# Patient Record
Sex: Female | Born: 1955 | Race: White | Hispanic: No | Marital: Married
Health system: Midwestern US, Community
[De-identification: ages and names within clinical notes are randomized; demographics above are authoritative.]

---

## 2014-09-05 NOTE — Telephone Encounter (Signed)
It looks like you just had her use Boudreaux for irritation and regular Analpram 2.5% for extrernal hemorrhoids in the past. She had [employer] self-insured last time she came in. I really don't think any insurances cover suppositories. Perhaps something over the counter? Or maybe I can give her CalMol samples if we have any left? Otherwise, she may need an OV to discuss possible surgical intervention?

## 2014-09-05 NOTE — Telephone Encounter (Signed)
LM for patient to try CalMol for a few weeks. If no improvement, or worsened sx, call for an OV.

## 2014-09-05 NOTE — Telephone Encounter (Signed)
Use Calmol and hydrocortisone 2.5% cream externally.  Good idea.

## 2014-09-05 NOTE — Telephone Encounter (Signed)
I need her record to review.  Also her insurance may dictate what I can prescribe.  If she is the one who used the Advanced Kit, there is no alternative.

## 2014-09-05 NOTE — Telephone Encounter (Signed)
Wants a hemorrhoid cream/treatment that is internal as well as the external Analpram she was prescribed last time. She has internal and external hemorrhoids, and the internal ones are flared up right now. I am not sure what is available for internal, if you need to see her I can make an OV, she said she was "just checked out" a few months ago (colonoscopy) and it's just the same usual hemorrhoids. Advise please.

## 2018-09-01 ENCOUNTER — Ambulatory Visit: Admit: 2018-09-01 | Discharge: 2018-09-01 | Payer: PRIVATE HEALTH INSURANCE | Attending: Surgery

## 2018-09-01 DIAGNOSIS — N816 Rectocele: Secondary | ICD-10-CM

## 2018-09-01 NOTE — Progress Notes (Addendum)
PATIENT NAME: Sheri Mcdaniel    DOB:  12-Jun-1956    MRN:  [id number]   TODAY'S DATE:  09/01/2018    Chief Complaint   Patient presents with   . Surgical Consult     pt has feeling of incomplete emptying, also abd pain, last colonoscopy 2015 - no polyps (Dr. Hedwig Morton)     SUBJECTIVE:  Sheri Mcdaniel is a 62 y.o. female with a feeling of incomplete emptying with bowel movements. This started about 1 year ago.  Pt states she goes everyday and sometimes 3 times a day because she feels like it is incomplete.  Pt is straining to have a BM.  Pt states that when she tries to go and stands up she feels like she has to sit right back down and go again. The consistency is like soft and gummy, occasionally it is extremely hard.  She had 3 vaginal births, and she did have an episiotomy with the first one.  She sometimes has to manually disimpact her self to get the stool out and does not have to place a finger in there vagina.  She had 2 bladder prolapse repairs. Pt has gained 20 lbs this year.     2015 colonoscopy for rectal bleeding normal    Review of Systems   Constitutional: Negative for chills and fever.   HENT: Negative for ear pain and sinus pain.    Eyes: Negative for pain and redness.   Respiratory: Negative for chest tightness and shortness of breath.    Cardiovascular: Negative for chest pain and palpitations.   Gastrointestinal: Negative for abdominal distention and vomiting.   Genitourinary: Negative for difficulty urinating and dysuria.   Musculoskeletal: Negative for arthralgias and myalgias.   Skin: Negative for color change and rash.   Psychiatric/Behavioral: Negative for agitation and confusion.       Past Medical History:   Diagnosis Date   . Kidney failure     Stage 2       Past Surgical History:   Procedure Laterality Date   . BLADDER SURGERY      Bladder Uplift (twice)   . CHOLECYSTECTOMY     . HYSTERECTOMY         Current Outpatient Medications   Medication Sig Dispense Refill   . Vitamin E 400 units  TABS Take one(1) tablet daily.     Marland Kitchen dicyclomine (BENTYL) 10 MG capsule TAKE 1 CAPSULE BY MOUTH FOUR TIMES DAILY  0   . potassium chloride (KLOR-CON) 10 MEQ extended release tablet      . metOLazone (ZAROXOLYN) 5 MG tablet      . naproxen (NAPROSYN) 500 MG tablet      . montelukast (SINGULAIR) 10 MG tablet Take 10 mg by mouth     . omeprazole (PRILOSEC) 40 MG delayed release capsule Take 40 mg by mouth     . MAGNESIUM PO Take 350 mg by mouth     . Cholecalciferol (VITAMIN D3 PO) Take 4,000 Units by mouth     . amoxicillin-clavulanate (AUGMENTIN) 875-125 MG per tablet   0   . OSPHENA 60 MG TABS   11     No current facility-administered medications for this visit.        Social History     Socioeconomic History   . Marital status: Married     Spouse name: Not on file   . Number of children: Not on file   . Years of education: Not on file   .  Highest education level: Not on file   Occupational History   . Not on file   Social Needs   . Financial resource strain: Not on file   . Food insecurity:     Worry: Not on file     Inability: Not on file   . Transportation needs:     Medical: Not on file     Non-medical: Not on file   Tobacco Use   . Smoking status: Never Smoker   . Smokeless tobacco: Never Used   Substance and Sexual Activity   . Alcohol use: Not Currently   . Drug use: Never   . Sexual activity: Not Currently   Lifestyle   . Physical activity:     Days per week: Not on file     Minutes per session: Not on file   . Stress: Not on file   Relationships   . Social connections:     Talks on phone: Not on file     Gets together: Not on file     Attends religious service: Not on file     Active member of club or organization: Not on file     Attends meetings of clubs or organizations: Not on file     Relationship status: Not on file   . Intimate partner violence:     Fear of current or ex partner: Not on file     Emotionally abused: Not on file     Physically abused: Not on file     Forced sexual activity: Not on file    Other Topics Concern   . Not on file   Social History Narrative   . Not on file       Family History   Problem Relation Age of Onset   . Breast Cancer Maternal Aunt 65   . Uterine Cancer Daughter 20       Allergies:  Allergies   Allergen Reactions   . Latex    . Adhesive Tape        OBJECTIVE:  BP (!) 143/76   Pulse 100   Temp 98.9 F (37.2 C) (Temporal)   Ht 5' (1.524 m)   Wt 148 lb (67.1 kg)   BMI 28.90 kg/m     GENERAL: No acute distress  HEENT:  NCAT, Pupils equal, EOMI, normal conjunctivae, no scleral icterus  NECK: Symmetric, trachea midline, no obvious thyromegaly  PULMONARY:  Normal respiratory effort, no respiratory distress, no stridor, chest non-tender  CARDIO: regular rate, no edema  ABDOMEN: soft, non-distended, non-tender, no guarding, no rebound, no obvious hepatosplenomegaly.   SKIN: Skin warm and well perfused, no cyanosis atraumatic, no rashes, no lesions  NEURO:  Cranial nerves grossly intact with no focal neuro defectis, motor and sensation grossly intact  PSYCHIATRIC: Appropriate mood and affect.  Appropriate judgment and insight    Perianal skin appears normal, no rashes.  Patient with external hemorrhoids in the right anterior right posterior and left lateral position that are small without any thrombosis  Perianal sensation normal.     Digital Exam: Tone: Normal; Palpable masses: No.  Small anterior rectocele.    Anoscope: Internal hemorrhoids are mildly enlarged, not friable without any bleeding in the left lateral, right posterior and right anterior positions.    Toilet exam: no prolapse noted.    Exam chaperoned by female assistant.      ASSESSMENT/PLAN:   Diagnosis Orders   1. Rectocele  ANOSCOPY, DIAGNOSTIC   2. Tenesmus (rectal)  ANOSCOPY, DIAGNOSTIC   3. External hemorrhoid  ANOSCOPY, DIAGNOSTIC   4. Internal hemorrhoid  ANOSCOPY, DIAGNOSTIC   5. Change in bowel habit  ANOSCOPY, DIAGNOSTIC     Patient with an anterior rectocele likely causing her issues of incomplete emptying.   Patient is due for a colonoscopy in 2020, with these changes in bowel habit would recommend a colonoscopy now to rule out any other causes.  Would recommend fiber powder therapy and adequate water intake daily.  If symptoms do not improve and patient has no lesion seen on colonoscopy would likely need a rectocele repair.  May need to perform defecography if symptoms are not improving.  If patient needs a rectocele repair would refer to urogynecology.    Details of colonoscopy are discussed including the risks of bleeding, perforation, splenic injury, respiratory and anesthetic complications.  We will schedule for colonoscopy.    Return for scheduled procedure.    The patient was given the opportunity to ask questions and all questions were answered to the best of my ability. The patient agrees with the above noted plan.    The patient was seen and examined independently and relevant data reviewed by myself. A full chart review including labs, imaging, endoscopic reports, pathology, and requesting/reviewing previous records was performed when available.

## 2018-09-18 ENCOUNTER — Inpatient Hospital Stay: Admit: 2018-09-18 | Payer: PRIVATE HEALTH INSURANCE | Attending: Surgery

## 2018-09-18 MED ORDER — NORMAL SALINE FLUSH 0.9 % IV SOLN
0.9 % | Freq: Two times a day (BID) | INTRAVENOUS | Status: DC
Start: 2018-09-18 — End: 2018-09-18

## 2018-09-18 MED ORDER — LIDOCAINE HCL (PF) 2 % IJ SOLN
2 % | INTRAMUSCULAR | Status: AC
Start: 2018-09-18 — End: ?

## 2018-09-18 MED ORDER — SODIUM CHLORIDE 0.9 % IV SOLN
0.9 % | INTRAVENOUS | Status: DC
Start: 2018-09-18 — End: 2018-09-18

## 2018-09-18 MED ORDER — NORMAL SALINE FLUSH 0.9 % IV SOLN
0.9 % | INTRAVENOUS | Status: DC | PRN
Start: 2018-09-18 — End: 2018-09-18

## 2018-09-18 MED ORDER — PROPOFOL 200 MG/20ML IV EMUL
200 MG/20ML | INTRAVENOUS | Status: AC
Start: 2018-09-18 — End: ?

## 2018-09-18 MED ORDER — SODIUM CHLORIDE 0.9 % IV SOLN
0.9 % | INTRAVENOUS | Status: DC
Start: 2018-09-18 — End: 2018-09-18
  Administered 2018-09-18: 14:00:00 via INTRAVENOUS

## 2018-09-18 MED FILL — LIDOCAINE HCL (PF) 2 % IJ SOLN: 2 % | INTRAMUSCULAR | Qty: 5

## 2018-09-18 MED FILL — PROPOFOL 200 MG/20ML IV EMUL: 200 MG/20ML | INTRAVENOUS | Qty: 60

## 2018-09-18 NOTE — Other (Unsigned)
Patient Acct Nbr: [id number]   Primary AUTH/CERT:   Primary Insurance Company Name: [employer]  Primary Insurance Plan name: [employer]  Primary Insurance Group Number: [id number]  Primary Insurance Plan Type: Health  Primary Insurance Policy Number: [id number]

## 2018-09-18 NOTE — Discharge Instructions (Signed)
Colonoscopy: What to expect at home    ACTIVITY:  DO NOT DRIVE, OPERATE MACHINERY, OR DRINK ANY ALCOHOL TODAY.     Avoid making critical decisions, signing legal documents, or performing any activity that requires alertness for the rest of the day.     You may be bloated or have gas pains since air was introduced into the colon for the procedure. You may need to pass the gas throughout the day.     You may experience a small amount of rectal bleeding; this can be normal after your colonoscopy. Notify your physician if the bleeding is enough to saturate your clothes.  Rest the remainder of the day.    You may resume normal activity tomorrow.     You may return to work tomorrow.    DIET:  You may resume a normal diet unless notified or recommended by your physician.    You may be eager to eat a large meal after fasting, but it is a good idea to start with light meals and ease into solid foods the first day. (*)    If your stomach is upset, try clear liquids and bland, low-fat foods like plain toast or rice.  Drink plenty of fluids for the first 24 hours (unless your physician states otherwise).    MEDICATION:  Resume your normal home medications unless notified or recommended by your physician.    If you take blood thinners (such as Coumadin, Eliquis, Plavix, Aspirin, etc.) or anti-inflammatory medications (Advil, Motrin, Aleve, etc.), ask your physician when you may resume these medications.    FOLLOW-UP APPOINTMENT:  Follow up with or call your physician as needed.    When to call for help:  Call your doctor IMMEDIATELY or seek medical care if you experience:  . Severe pain or vomiting  . A large amount (filling the toilet) of maroon, bloody stools or tar-like stools  . Your belly is swollen and firm with severe pain  . A  fever greater than 101 degrees  . Redness or swelling of arm from the IV site for more than 48 hours  . Sudden onset of chest pain or shortness of breath   . If you become extremely dizzy or pass  out (lose consciousness)   **IF YOU ARE UNABLE TO REACH YOUR PHYSICIAN GO TO NEAREST EMERGENCY DEPARTMENT**

## 2018-09-18 NOTE — H&P (Signed)
Endoscopy History and Physical    PATIENT NAME: Sheri Mcdaniel   DATE OF BIRTH: 08/02/1956    ADMISSION DATE: 09/18/2018  8:08 AM      TODAY'S DATE: 09/18/2018    HISTORY OF PRESENT ILLNESS:  The patient is a 62 y.o. female who presents with feeling of incomplete emptying with bowel movements. This started about 1 year ago. Likely has an anterior rectocele likely causing her issues of incomplete emptying. She had 3 vaginal births, and she did have an episiotomy with the first one.  She sometimes has to manually disimpact her self to get the stool out and does not have to place a finger in there vagina.  Patient is due for a colonoscopy in 2020, with these changes in bowel habit would recommend a colonoscopy now to rule out any other causes.   Thoroughly reviewed the patient's medical history, family history, social history and review of systems with the patient today in the office.  Please see medical record for pertinent positives.     Past Medical History:        Diagnosis Date   ??? Kidney failure     Stage 2       Past Surgical History:        Procedure Laterality Date   ??? BLADDER SURGERY      Bladder Uplift (twice)   ??? CHOLECYSTECTOMY     ??? HYSTERECTOMY         Current Medications:   Current Facility-Administered Medications: 0.9 % sodium chloride infusion, , Intravenous, Continuous  0.9 % sodium chloride infusion, , Intravenous, Continuous  sodium chloride flush 0.9 % injection 10 mL, 10 mL, Intravenous, 2 times per day  sodium chloride flush 0.9 % injection 10 mL, 10 mL, Intravenous, PRN  Prior to Admission medications    Medication Sig Start Date End Date Taking? Authorizing Provider   Vitamin E 400 units TABS Take one(1) tablet daily. 03/19/03  Yes Historical Provider, MD   dicyclomine (BENTYL) 10 MG capsule TAKE 1 CAPSULE BY MOUTH FOUR TIMES DAILY 08/07/18  Yes Historical Provider, MD   potassium chloride (KLOR-CON) 10 MEQ extended release tablet  07/27/18  Yes Historical Provider, MD   metOLazone (ZAROXOLYN)  5 MG tablet  07/27/18  Yes Historical Provider, MD   naproxen (NAPROSYN) 500 MG tablet  07/19/18  Yes Historical Provider, MD   montelukast (SINGULAIR) 10 MG tablet Take 10 mg by mouth   Yes Historical Provider, MD   omeprazole (PRILOSEC) 40 MG delayed release capsule Take 40 mg by mouth   Yes Historical Provider, MD   MAGNESIUM PO Take 350 mg by mouth   Yes Historical Provider, MD   Cholecalciferol (VITAMIN D3 PO) Take 4,000 Units by mouth   Yes Historical Provider, MD   amoxicillin-clavulanate (AUGMENTIN) 875-125 MG per tablet  07/08/14  Yes Historical Provider, MD   OSPHENA 60 MG TABS  08/30/14  Yes Historical Provider, MD        Allergies:  Latex and Adhesive tape    Social History:   Social History     Socioeconomic History   ??? Marital status: Married     Spouse name: Not on file   ??? Number of children: Not on file   ??? Years of education: Not on file   ??? Highest education level: Not on file   Occupational History   ??? Not on file   Social Needs   ??? Financial resource strain: Not on file   ???  Food insecurity:     Worry: Not on file     Inability: Not on file   ??? Transportation needs:     Medical: Not on file     Non-medical: Not on file   Tobacco Use   ??? Smoking status: Never Smoker   ??? Smokeless tobacco: Never Used   Substance and Sexual Activity   ??? Alcohol use: Not Currently   ??? Drug use: Never   ??? Sexual activity: Not Currently   Lifestyle   ??? Physical activity:     Days per week: Not on file     Minutes per session: Not on file   ??? Stress: Not on file   Relationships   ??? Social connections:     Talks on phone: Not on file     Gets together: Not on file     Attends religious service: Not on file     Active member of club or organization: Not on file     Attends meetings of clubs or organizations: Not on file     Relationship status: Not on file   ??? Intimate partner violence:     Fear of current or ex partner: Not on file     Emotionally abused: Not on file     Physically abused: Not on file     Forced sexual  activity: Not on file   Other Topics Concern   ??? Not on file   Social History Narrative   ??? Not on file       Family History:        Problem Relation Age of Onset   ??? Breast Cancer Maternal Aunt 65   ??? Uterine Cancer Daughter [phone]       REVIEW OF SYSTEMS:  CONSTITUTIONAL:  Negative for fatigue, and unexpected weight change  RESPIRATORY:  Negative for cough, SOB, and wheezing  CARDIOVASCULAR:  Negative for chest pains and palpatations  GASTROINTESTINAL:   frequency  HEMATOLOGIC/LYMPHATIC:  Negative for adenopathy. Does not bruise/bleed easily.  NEUROLOGICAL:  Negative for seizures and syncope  * All other ROS reviewed see HPI for pertinent positives and negatives.      PHYSICAL EXAM:  VITALS:  There were no vitals filed for this visit.     GENERAL:  Oriented to person, place, and time. Appears well nourished. No distress   ENT:  Normocepalic,atraumatic, without obvious abnormality  NECK:  supple, symmetrical, trachea midline  LUNGS: Resp effort easy and unlabored  CARDIOVASCULAR:  Regular rate  ABDOMEN:  Soft, non-tender, no open wounds.  MUSCULOSKELETAL: moves all extremities  NEUROLOGIC:  No focal neurologic deficits    IMPRESSION/RECOMMENDATIONS:  Incomplete emptying, change in bowel habits, anterior rectocele  Colonoscopy with possible biopsy / polypectomy    Details of the procedure were discussed including the risks of bleeding, perforation, splenic injury, respiratory and anesthetic complications.    Patient counseled on risks, benefits, and alternatives of treatment plan at length. Patient states an understanding and willingness to proceed with plan.    Electronically signed by Corie Chiquitoruong Aryiah Monterosso, MD on 09/18/2018 at 8:30 AM

## 2018-09-18 NOTE — Anesthesia Post-Procedure Evaluation (Signed)
Department of Anesthesiology                             Post Anesthesia Note    Name: Sheri Mcdaniel MRN: [id number] DOB: 12/14/1955    (Age-62 y.o.)     This note was authored with review of the notes of the PACU and/or Same day stay and/or direct communication with the patient with an anesthesia care provider.     POST  ANESTHESIA  EVALUATION      Patient Vitals for the past 2 hrs:   BP Pulse Resp SpO2   09/18/18 1022 118/73 80 18 98 %   09/18/18 1005 115/62 83 16 95 %   09/18/18 0955 110/60 82 16 91 %   09/18/18 0945 (!) 99/56 87 16 94 %        BP 118/73    Pulse 80    Temp 97.3 ??F (36.3 ??C) (Temporal)    Resp 18    Ht 5\' 1"  (1.549 m)    Wt 143 lb (64.9 kg)    SpO2 98%    BMI 27.02 kg/m??      Pain Level: 0    Vital signs: Respiratory and Cardiovascular function within normal limits (See Nursing Record)  Level of consciousness: Patient awake/ Able to participate      Return to baseline mental status: Yes Airway status: Normal   Pain controlled: Yes Dental injury: No   Nausea/Vomiting controlled: Yes Complications: no   Well hydrated: Yes      Patient Experience comments: None expressed    Electronically signed by Jeannine BogaMark W Jakaylah Schlafer, APRN - CRNA on 09/18/2018 at 11:03 AM

## 2018-09-19 LAB — SURGICAL PATHOLOGY

## 2018-09-26 ENCOUNTER — Encounter: Attending: Surgery

## 2018-10-10 ENCOUNTER — Ambulatory Visit: Admit: 2018-10-10 | Discharge: 2018-10-10 | Payer: PRIVATE HEALTH INSURANCE | Attending: Surgery

## 2018-10-10 DIAGNOSIS — K635 Polyp of colon: Secondary | ICD-10-CM

## 2018-10-10 NOTE — Progress Notes (Signed)
PATIENT NAME: Sheri Mcdaniel    DOB:  10/23/1955    MRN:  [id number]   TODAY'S DATE:  10/10/2018    Chief Complaint   Patient presents with   . Follow-up     follow-up from scope on 12.30.2019, pt has been having some diarrhea (no real constipation right now), no other problems or concerns     SUBJECTIVE:  Sheri Mcdaniel is a 63 y.o. female with a feeling of incomplete emptying with bowel movements. This started about 1 year ago.  Pt states she goes everyday and sometimes 3 times a day because she feels like it is incomplete.  Pt is straining to have a BM.  Pt states that when she tries to go and stands up she feels like she has to sit right back down and go again. The consistency is like soft and gummy, occasionally it is extremely hard.  She had 3 vaginal births, and she did have an episiotomy with the first one.  She sometimes has to manually disimpact her self to get the stool out and does not have to place a finger in the vagina.  She had 2 bladder prolapse repairs. Pt has gained 20 lbs this year.     She underwent a colonoscopy on 09/18/18 that showed hepatic flexure tubular adenoma and focal area of ischemia/ulcer in rectum.  Pt states that she is using magnesium supplement and Calm (magnesium supplement) and fiber gummies.      09/18/18 colonoscopy: hepatic flexure tubular adenoma, focal rectal inflammation/ulceration read as ischemia on biopsy  DIAGNOSIS:  A. COLON, HEPATIC FLEXURE, POLYPECTOMY - TUBULAR ADENOMA.  B. RECTUM, BIOPSY - FRAGMENT OF COLONIC MUCOSA WITH FOCAL SURFACE  EROSION AND FEATURES SUGGESTIVE OF ISCHEMIA.  COMMENT: Sections of the rectal biopsy show colonic mucosa with  surface erosion and loss of the surface epithelium. A mild  increase in lamina propria fibrosis is also noted. No granulomas,  crypt abscesses, or cryptitis is identified. Focal changes  suggestive of ischemia including gland dropout are also noted.      Review of Systems   Constitutional: Negative for chills and  fever.   HENT: Negative for ear pain and sinus pain.    Eyes: Negative for pain and redness.   Respiratory: Negative for chest tightness and shortness of breath.    Cardiovascular: Negative for chest pain and palpitations.   Gastrointestinal: Negative for abdominal distention and vomiting.   Genitourinary: Negative for difficulty urinating and dysuria.   Musculoskeletal: Negative for arthralgias and myalgias.   Skin: Negative for color change and rash.   Psychiatric/Behavioral: Negative for agitation and confusion.       Past Medical History:   Diagnosis Date   . Kidney failure     Stage 2       Past Surgical History:   Procedure Laterality Date   . BLADDER SURGERY      Bladder Uplift (twice)   . CHOLECYSTECTOMY     . COLONOSCOPY  09/18/2018    polyp, rectal bx   . HYSTERECTOMY         Current Outpatient Medications   Medication Sig Dispense Refill   . NONFORMULARY Natural Calm     . NONFORMULARY Fiber Well Fit     . cetirizine (ZYRTEC) 10 MG tablet TAKE 1 TABLET BY MOUTH TWICE DAILY for 12 days     . SUMAtriptan (IMITREX) 100 MG tablet      . Vitamin E 400 units TABS Take one(1) tablet daily.     Marland Kitchen  dicyclomine (BENTYL) 10 MG capsule TAKE 1 CAPSULE BY MOUTH FOUR TIMES DAILY  0   . potassium chloride (KLOR-CON) 10 MEQ extended release tablet      . naproxen (NAPROSYN) 500 MG tablet      . montelukast (SINGULAIR) 10 MG tablet Take 10 mg by mouth     . omeprazole (PRILOSEC) 40 MG delayed release capsule Take 40 mg by mouth     . MAGNESIUM PO Take 350 mg by mouth 3 times daily      . Cholecalciferol (VITAMIN D3 PO) Take 4,000 Units by mouth     . metOLazone (ZAROXOLYN) 5 MG tablet      . OSPHENA 60 MG TABS   11     No current facility-administered medications for this visit.        Social History     Socioeconomic History   . Marital status: Married     Spouse name: Not on file   . Number of children: Not on file   . Years of education: Not on file   . Highest education level: Not on file   Occupational History   . Not  on file   Social Needs   . Financial resource strain: Not on file   . Food insecurity:     Worry: Not on file     Inability: Not on file   . Transportation needs:     Medical: Not on file     Non-medical: Not on file   Tobacco Use   . Smoking status: Never Smoker   . Smokeless tobacco: Never Used   Substance and Sexual Activity   . Alcohol use: Not Currently   . Drug use: Never   . Sexual activity: Not Currently   Lifestyle   . Physical activity:     Days per week: Not on file     Minutes per session: Not on file   . Stress: Not on file   Relationships   . Social connections:     Talks on phone: Not on file     Gets together: Not on file     Attends religious service: Not on file     Active member of club or organization: Not on file     Attends meetings of clubs or organizations: Not on file     Relationship status: Not on file   . Intimate partner violence:     Fear of current or ex partner: Not on file     Emotionally abused: Not on file     Physically abused: Not on file     Forced sexual activity: Not on file   Other Topics Concern   . Not on file   Social History Narrative   . Not on file       Family History   Problem Relation Age of Onset   . Breast Cancer Maternal Aunt 65   . Uterine Cancer Daughter 81       Allergies:  Allergies   Allergen Reactions   . Latex    . Adhesive Tape        OBJECTIVE:  BP 128/87   Pulse 74   Temp 97.7 F (36.5 C) (Temporal)   Ht 5\' 1"  (1.549 m)   Wt 142 lb (64.4 kg)   BMI 26.83 kg/m     GENERAL: No acute distress  HEENT:  NCAT, Pupils equal, EOMI, normal conjunctivae, no scleral icterus  NECK: Symmetric, trachea midline, no obvious thyromegaly  PULMONARY:  Normal respiratory effort, no respiratory distress, no stridor, chest non-tender  CARDIO: regular rate, no edema  ABDOMEN: soft, non-distended, non-tender, no guarding, no rebound, no obvious hepatosplenomegaly.   SKIN: Skin warm and well perfused, no cyanosis atraumatic, no rashes, no lesions  NEURO:  Cranial nerves  grossly intact with no focal neuro defectis, motor and sensation grossly intact  PSYCHIATRIC: Appropriate mood and affect.  Appropriate judgment and insight    Exam chaperoned by female assistant.      ASSESSMENT/PLAN:   Diagnosis Orders   1. Polyp of hepatic flexure of colon     2. Rectocele       Patient with an anterior rectocele likely causing her issues of incomplete emptying.  She underwent a colonoscopy on 09/18/18 that showed hepatic flexure tubular adenoma and focal area of ischemia/ulcer in rectum.  Pt states that she is using magnesium supplement and Calm (magnesium supplement) and fiber gummies.       Would recommend fiber powder therapy instead of fiber gummies and adequate water intake daily.  Would recommend decreasing the amount of magnesium she is using because it is causing her to have loose stools.  If symptoms do not improve May need to perform defecography and possible referral to a urogynecologist for a  rectocele repair.  Patient can follow up as needed if she continues to have symptoms or if she is not getting better with the recommended management.      Return if symptoms worsen or fail to improve.    The patient was given the opportunity to ask questions and all questions were answered to the best of my ability. The patient agrees with the above noted plan.    The patient was seen and examined independently and relevant data reviewed by myself. A full chart review including labs, imaging, endoscopic reports, pathology, and requesting/reviewing previous records was performed when available.

## 2019-07-10 ENCOUNTER — Inpatient Hospital Stay: Admit: 2019-07-10

## 2019-07-10 NOTE — Other (Unsigned)
Patient Acct Nbr: [id number]   Primary AUTH/CERT:   Primary Insurance Company Name: [employer]  Primary Insurance Plan name: Rosann Auerbach  Primary Insurance Group Number: [id number]  Primary Insurance Plan Type: Health  Primary Insurance Policy Number: [id number]

## 2019-07-12 ENCOUNTER — Inpatient Hospital Stay: Admit: 2019-07-12

## 2019-07-12 NOTE — Other (Unsigned)
Patient Acct Nbr: [id number]   Primary AUTH/CERT:   Primary Insurance Company Name: [employer]  Primary Insurance Plan name: Rosann Auerbach  Primary Insurance Group Number: [id number]  Primary Insurance Plan Type: Health  Primary Insurance Policy Number: [id number]

## 2020-05-19 ENCOUNTER — Ambulatory Visit

## 2020-05-22 ENCOUNTER — Inpatient Hospital Stay: Admit: 2020-05-22

## 2020-07-22 ENCOUNTER — Inpatient Hospital Stay: Admit: 2020-07-22

## 2020-08-08 ENCOUNTER — Inpatient Hospital Stay: Admit: 2020-08-08

## 2020-08-08 MED ORDER — GADOBUTROL 1 MMOL/ML IV SOLN
1 MMOL/ML | Freq: Once | INTRAVENOUS | Status: AC | PRN
Start: 2020-08-08 — End: 2020-08-08
  Administered 2020-08-08: 14:00:00 6 mL via INTRAVENOUS

## 2020-08-08 NOTE — Other (Unsigned)
Patient Acct Nbr: [id number]   Primary AUTH/CERT: [id number]  Primary Insurance Company Name: Rosann Auerbach  Primary Insurance Plan name: Rosann Auerbach  Primary Insurance Group Number: [id number]  Primary Insurance Plan Type: Health  Primary Insurance Policy Number: [id number]

## 2020-08-29 NOTE — Telephone Encounter (Signed)
[facility]  Dr. Leighton Roach. and Evlyn Kanner.   [facility]                [address]  [place], [place] [address]                  tel: [phone]                fax: [phone]                [email or web address]  08/29/2020  RE: Sheri Mcdaniel  DOB: 1956/04/10      Dear Dr. Lynnea Ferrier,                                   Your patient has been identified as having an increased risk for breast cancer, hereditary breast and ovarian cancer (HBOC), or Lynch Syndrome (HNPCC mutation) based on the Cancer Risk Assessment (CRA) survey completed at the time of their mammogram at [facility].     Please refer to the mammogram report for documentation of the patient's risk scores. Scores were calculated based on your patient's individual and family history they provided at the time of their mammogram. Please note that changes or corrections to history may impact risk scores.    Please review these RISK RESULTS and if your patient is:    1. At higher risk for Lynch Syndrome (HNPCC - colon and/or uterine cancer), consider referral to a genetic counseling resource and referral for GI and GYN evaluation.  2. For patients with a Lifetime Breast Cancer Risk of 20% or greater, consider referral to [facility] for evaluation. Lifetime risk is per the validated statistical model Rockne Menghini. NCCN Guidelines recommend high risk follow-up as:  ??? Annual screening mammogram plus annual screening breast MRI  ??? Clinical breast exam every 6 months  3. For patients at risk for Hereditary Breast and Ovarian Cancer Syndrome (HBOC), please consider referral to [facility] for further evaluation.     Your patients with an elevated lifetime breast cancer risk and/or HBOC will also receive a letter from [facility] notifying them of their elevated risk score.     [facility]  Our Clinic is  staffed with breast specialists to help manage care for high risk patients. Our RN Navigator assists providers and their patients with making informed health care decisions and is available to assist you and coordinate follow-up high risk care or a referral to the Clinic.    For Questions or to make a Referral to [facility]:  ??? Call the High Risk RN Navigator, Laverle Hobby at [phone] or through [facility]  ??? Call the [facility] at [phone]  ??? Electronic referrals through [facility] or [id number] (Join the Network) Patient Portal  ([facility] Users can search ???[facility]???)  ??? Fax referrals can be sent to [phone]    Thank you and we look forward to hearing from you.     Sincerely,     [facility]

## 2023-11-11 IMAGING — CT CT LUMBAR SPINE WITHOUT CONTRAST
3 series · 11 of 33 positions shown, 13 images · non-contrast
Comparison: MRI lumbar spine from December 09, 2022.

________________________________________________________________________________________________ 
CT LUMBAR SPINE WITHOUT CONTRAST, 11/11/2023 [DATE]: 
CLINICAL INDICATION: Radiculopathy, Lumbar Region 
A search for DICOM formatted images was conducted for prior CT imaging studies 
completed at a non-affiliated media free facility.
TECHNIQUE: The lumbar spine was scanned from T12 through mid-sacrum without 
contrast on a high-resolution CT scanner using dose reduction techniques. 
Routine MPR reconstructions were performed. 3D images were performed on an 
Independent Workstation under physician supervision.

[Series 2: axial st · axial · 0.29mm/px · z∈[-284,-126]mm · 3 of 257 slices shown, 4 images]
[im 60/257  soft-tissue]
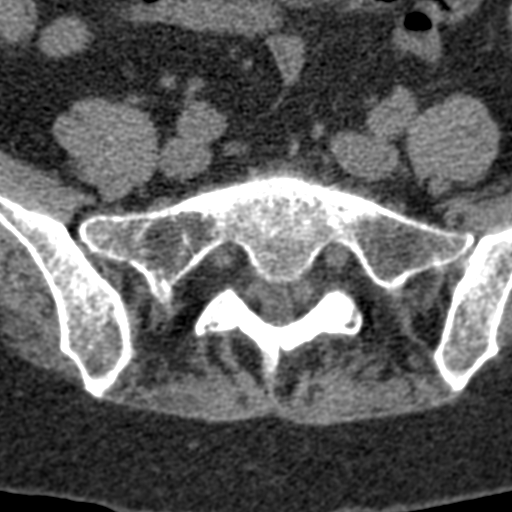
[im 60/257  bone]
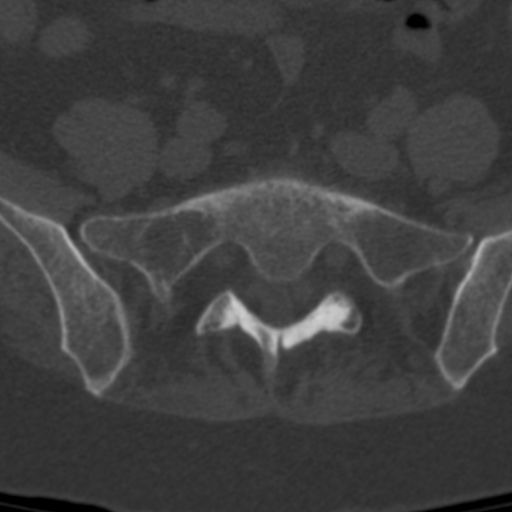
[im 138/257  bone]
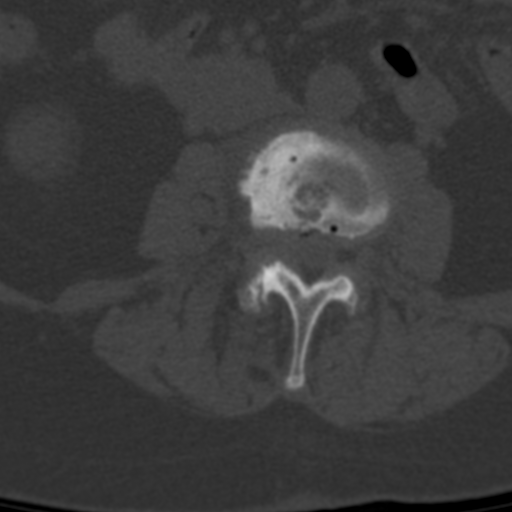
[im 217/257  bone]
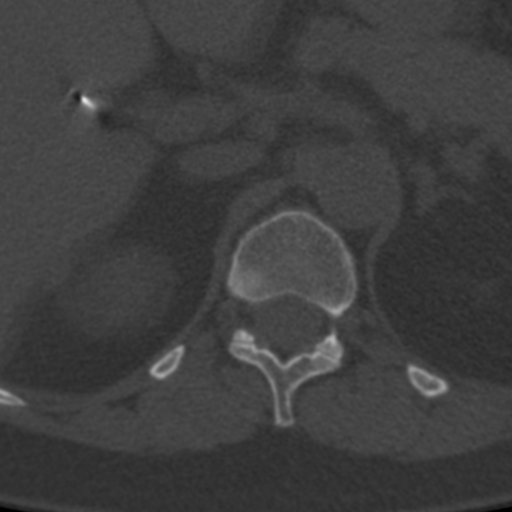

[Series 6: sag st · sagittal · 0.31mm/px · 5 of 128 slices shown, 6 images]
[im 43/128  bone]
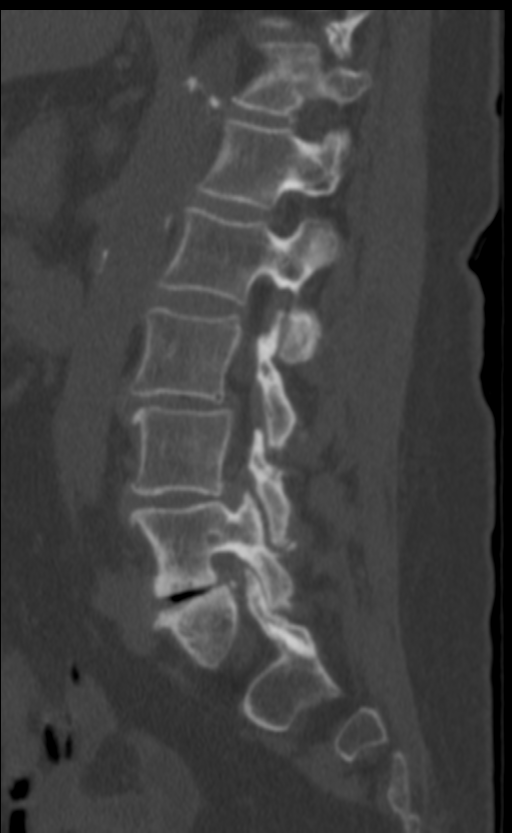
[im 53/128  bone]
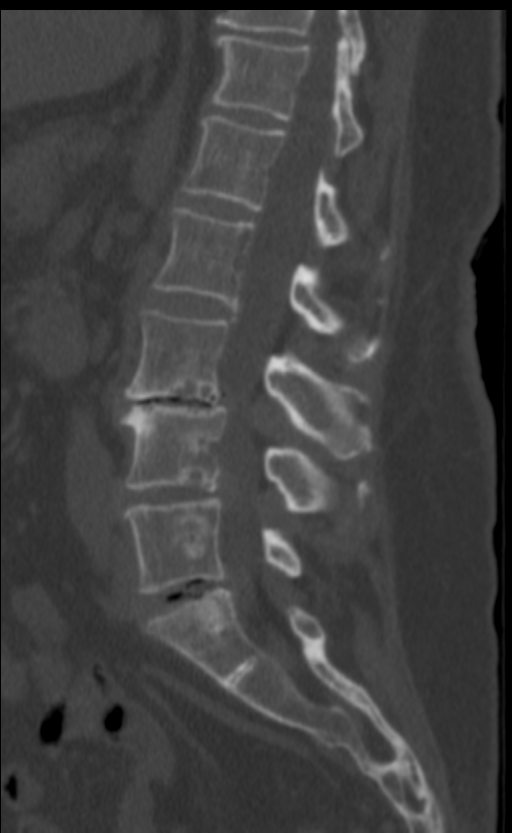
[im 64/128  soft-tissue]
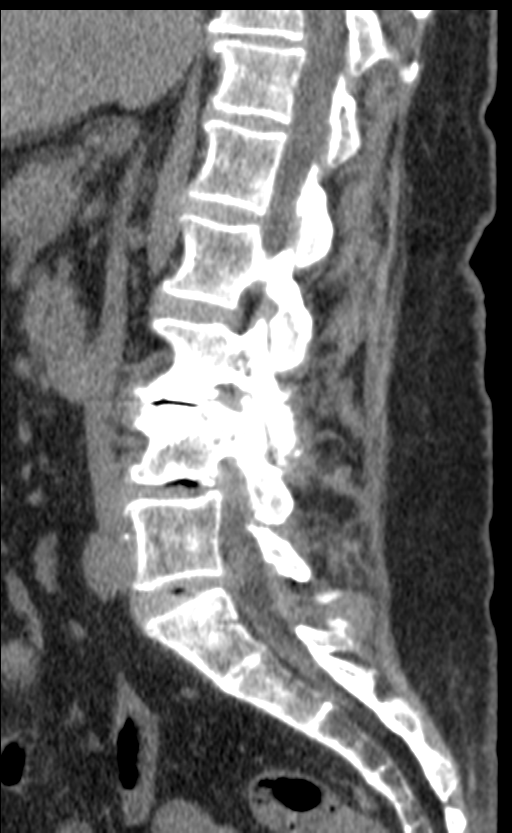
[im 64/128  bone]
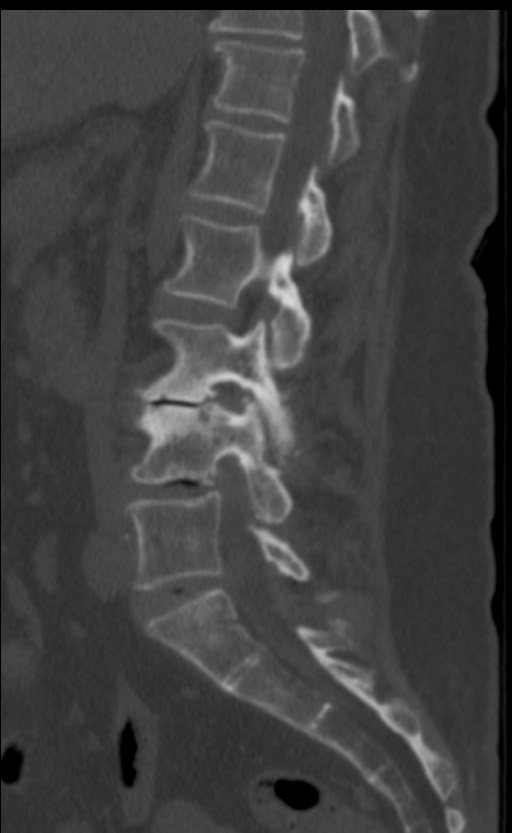
[im 75/128  bone]
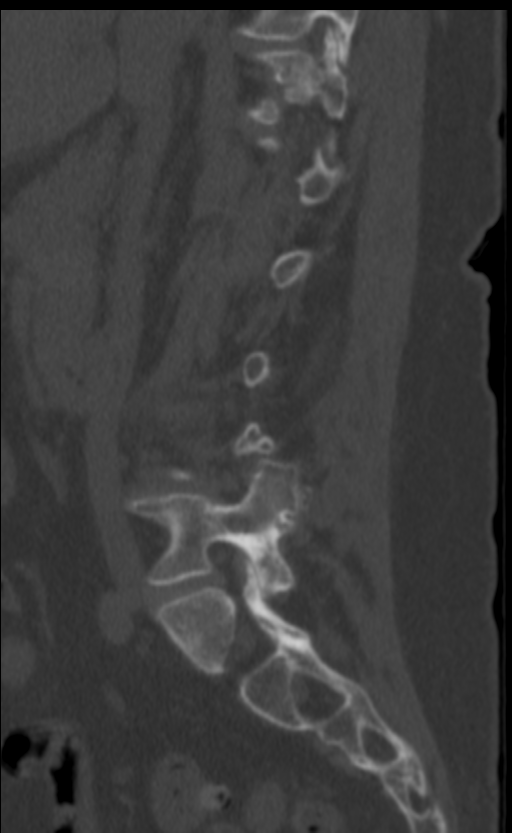
[im 85/128  bone]
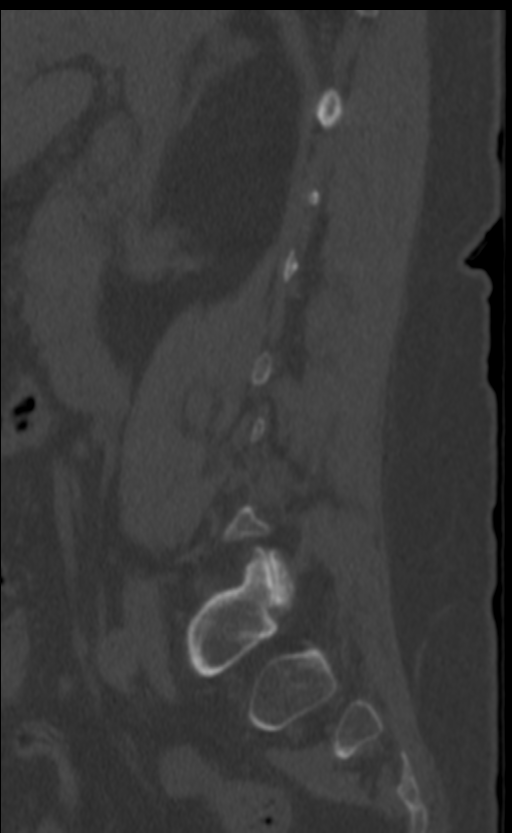

[Series 7: cor st · coronal · 0.28mm/px · 3 of 111 slices shown]
[im 23/111  bone]
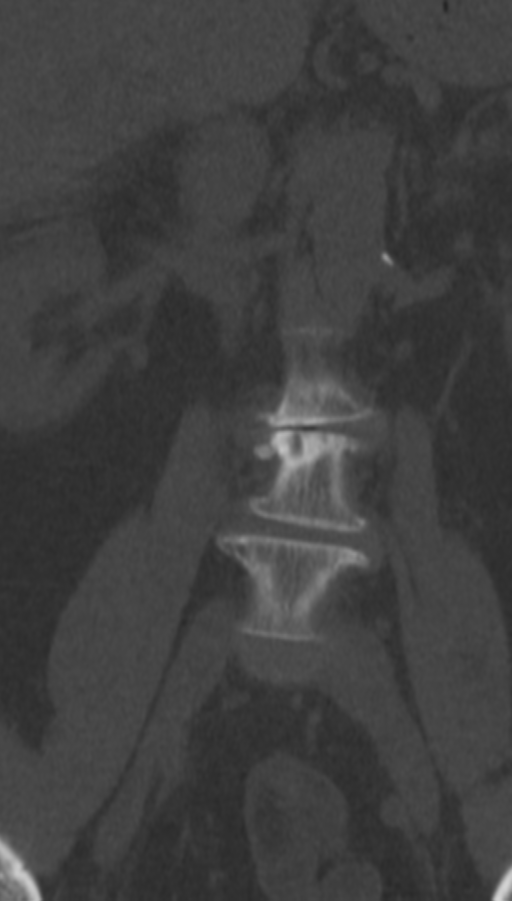
[im 45/111  bone]
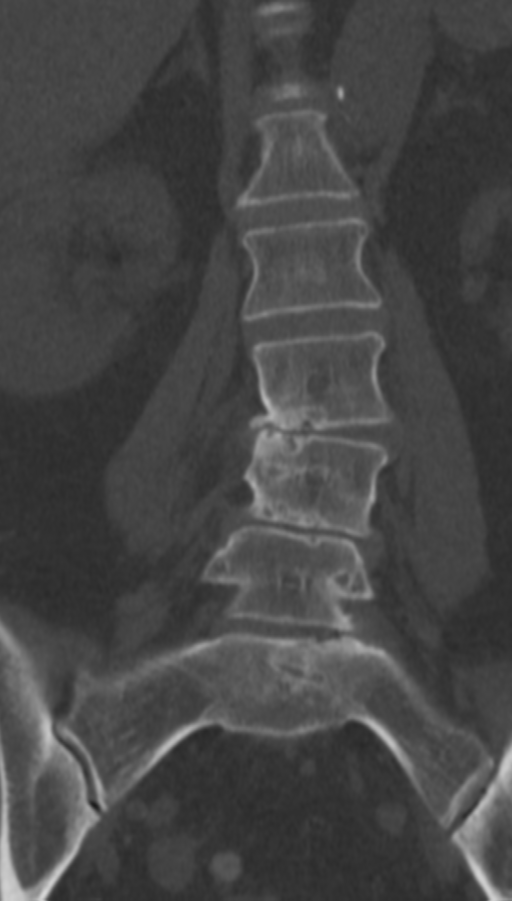
[im 67/111  bone]
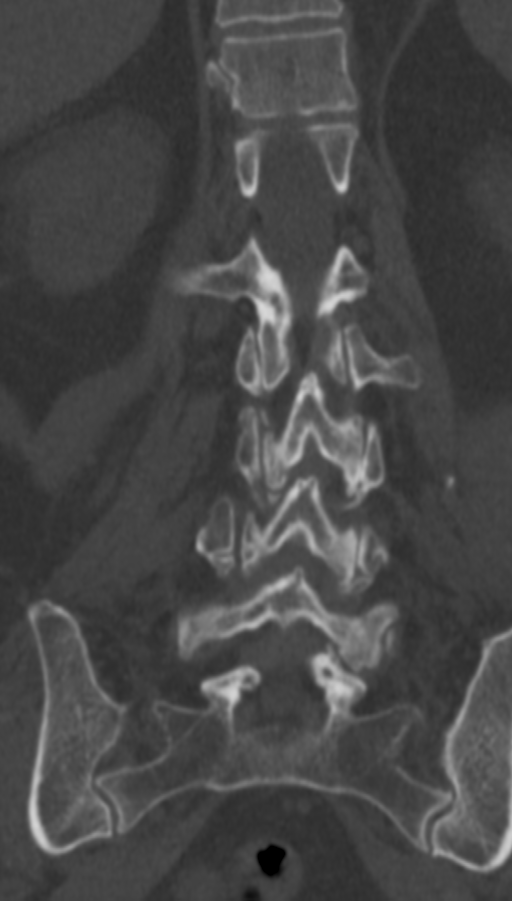

[11 of 33 positions shown; findings below may reference images not displayed]

Count of known CT and Cardiac Nuclear Medicine studies performed in the previous 
12 months = 0.
FINDINGS: -------------------------------------------------------------------------------- 
------ 
Stable spine alignment. Leftward curvature centered at L3-L4. Mild 
anterolisthesis of L3 on L4. Minimal retrolisthesis of L2 on L3. 
No acute fracture. No focal suspect lytic or blastic lesion. Visualized 
extraspinal soft tissues reveal no acute abnormality or large mass. Status post 
cholecystectomy. Vascular calcifications. 
Again seen are multilevel discogenic/degenerative change in the spine. Suspected 
severe right subarticular recess narrowing at L3-L4, progressed. Suspected 
severe right neural foraminal narrowing at L3-L4, progressed. Suspected severe 
bilateral neural foraminal narrowing at L4-L5, progressed.  
-------------------------------------------------------------------------------- 
------
IMPRESSION: Suspected progression in stenosis at L3-L4 and L4-L5 compared to prior MRI. 
Recommend repeat MRI lumbar spine for confirmation.  
RADIATION DOSE REDUCTION: All CT scans are performed using radiation dose 
reduction techniques, when applicable.  Technical factors are evaluated and 
adjusted to ensure appropriate moderation of exposure.  Automated dose 
management technology is applied to adjust the radiation doses to minimize 
exposure while achieving diagnostic quality images.

## 2023-11-28 IMAGING — MR MRI LUMBAR SPINE WITHOUT CONTRAST
7 of 9 series · 15 of 48 positions shown · IV contrast (gadolinium)
Comparison: MRI lumbar spine from December 09, 2022.

________________________________________________________________________________________________ 
MRI LUMBAR SPINE WITHOUT CONTRAST, 11/28/2023 [DATE]: 
CLINICAL INDICATION: Low-back pain with radiation to right hip and thigh.
TECHNIQUE: Multiplanar, multiecho position MR images of the lumbar spine were 
performed without intravenous gadolinium enhancement. Patient was scanned on a 
1.5T magnet

[Series 101: survey · axial · 10.0mm · 1.25mm/px · z∈[-33,+201]mm · 2 of 10 slices shown]
[im 1/10]
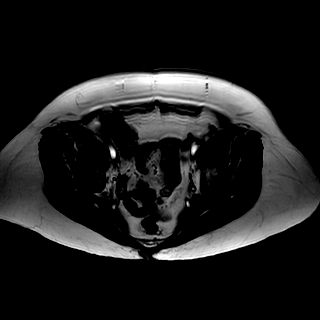
[im 10/10]
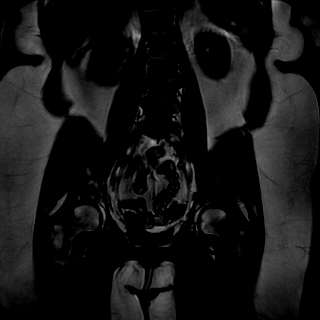

[Series 201: t2w_cor-surv · coronal · 6.0mm · 0.62mm/px · 1 of 10 slices shown]
[im 1/10]
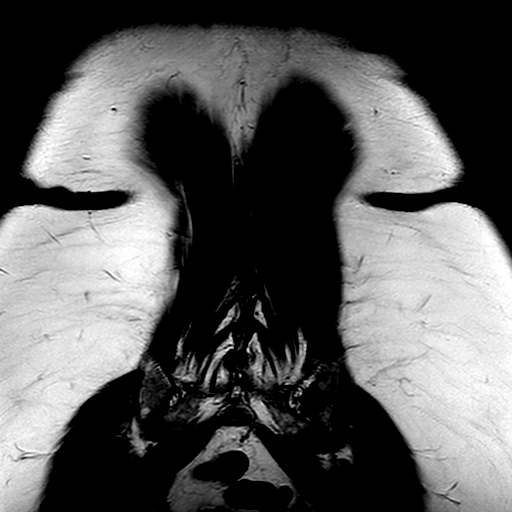

[Series 301: t1_tse_sag · sagittal · 4.0mm · 0.44mm/px · 2 of 19 slices shown]
[im 1/19]
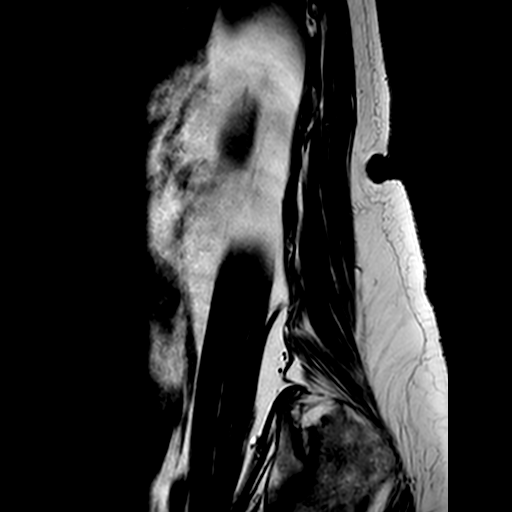
[im 19/19]
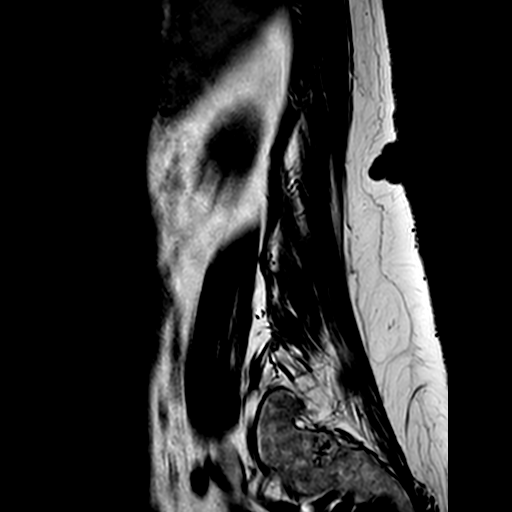

[Series 402: (id)_mdixon_tse · sagittal · 4.0mm · 0.53mm/px · 2 of 19 slices shown]
[im 1/19]
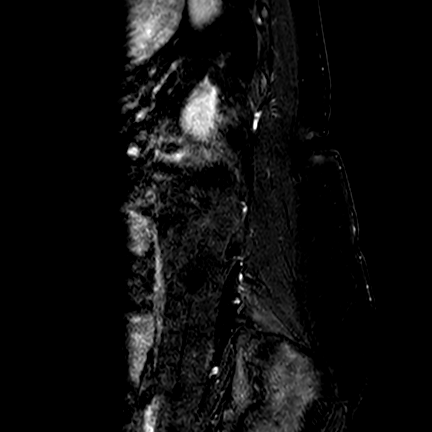
[im 19/19]
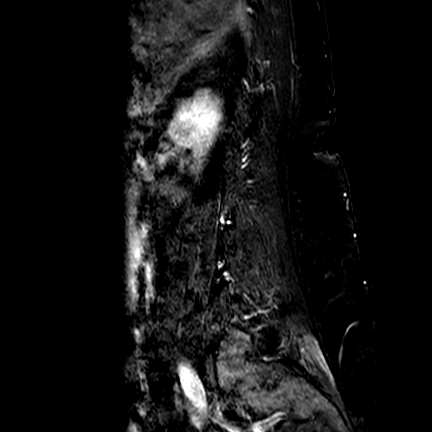

[Series 403: st2w_mdixon_tse · sagittal · 4.0mm · 0.53mm/px · 2 of 19 slices shown]
[im 1/19]
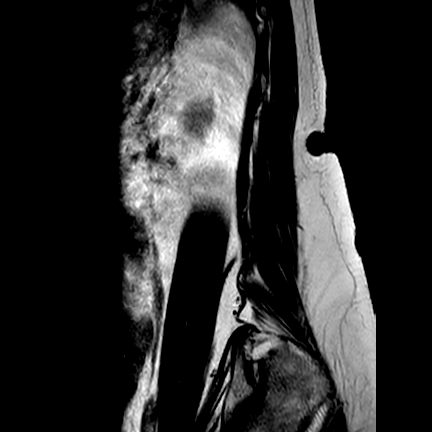
[im 19/19]
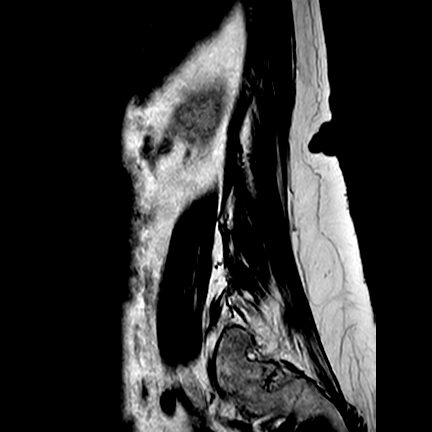

[Series 502: (id) view_ax mpr · axial · 1.0mm · 0.25mm/px · 1 of 137 slices shown]
[im 10/137]
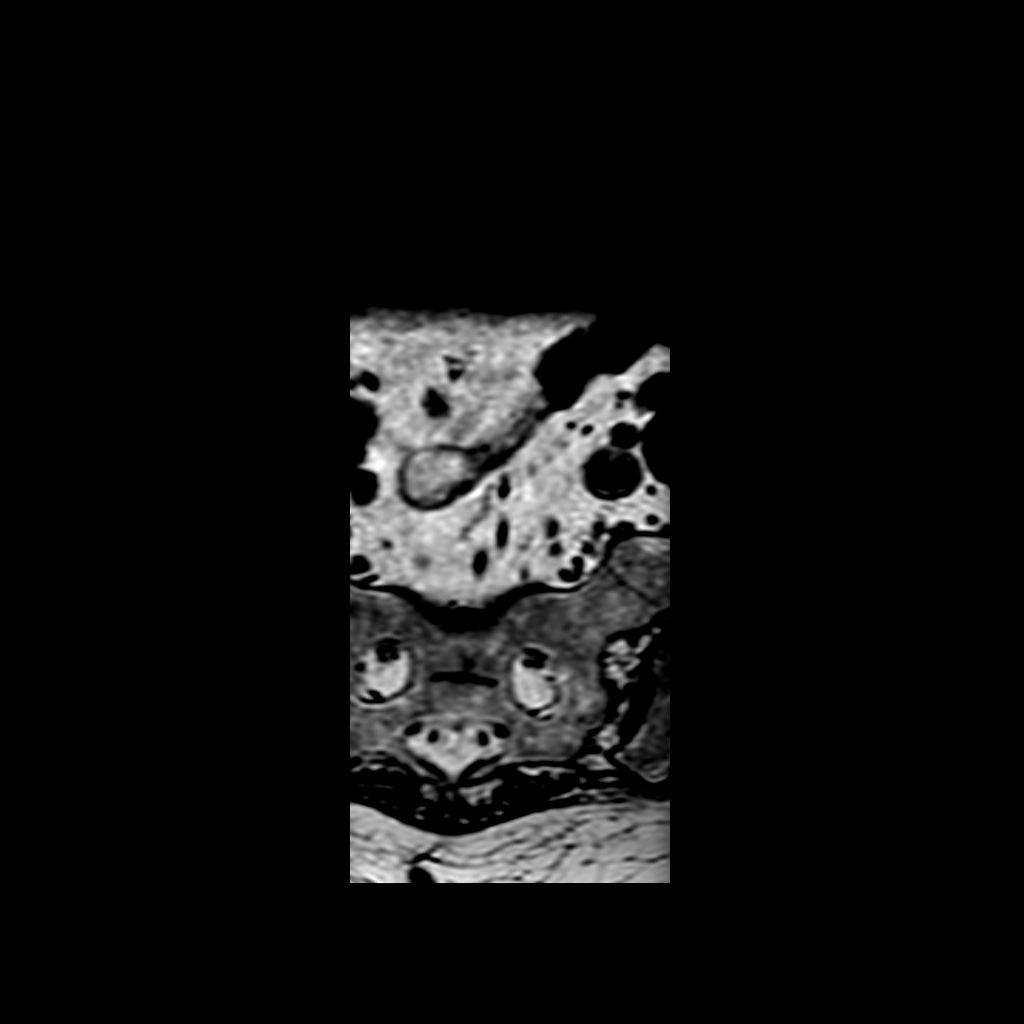

[Series 701: T1 · axial · 4.0mm · 0.39mm/px · z∈[+29,+211]mm · 5 of 42 slices shown]
[im 1/42]
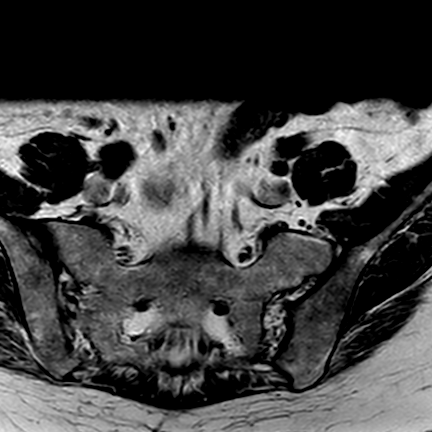
[im 11/42]
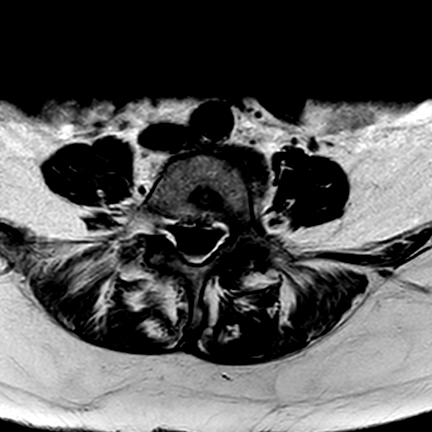
[im 21/42]
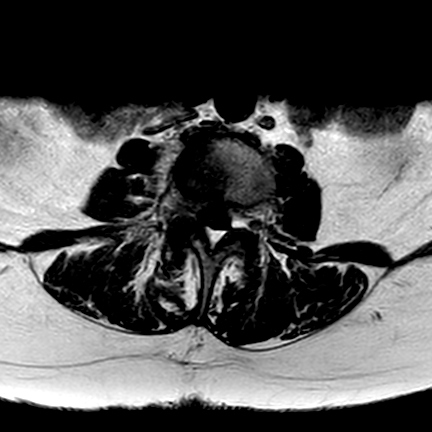
[im 31/42]
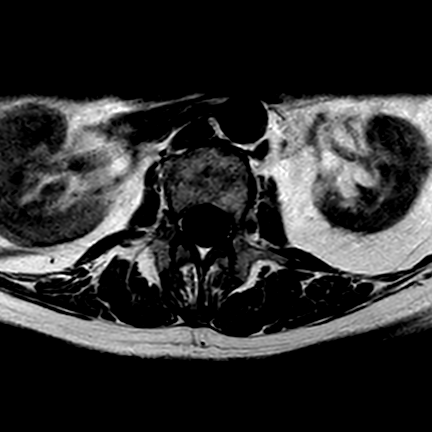
[im 42/42]
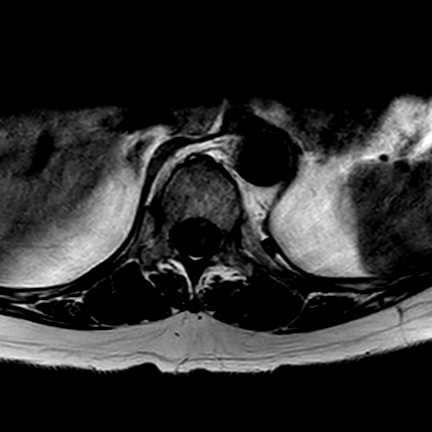

[15 of 48 positions shown; findings below may reference images not displayed]

FINDINGS: -------------------------------------------------------------------------------- 
------ 
GENERAL: 
Nomenclature is based on 5 lumbar type vertebral bodies.     
ALIGNMENT: Leftward curvature of the lumbar spine. Left lateral subluxation of 
L4 on L5. Grade 1 anterolisthesis of L3 on L4. Very mild anterolisthesis of L4 
on L5. 
VERTEBRAL BODY HEIGHT: Normal.  
MARROW SIGNAL: Active endplate change at L3-L4 eccentric towards the right side. 
Active endplate change at L5-S1 eccentric towards the left side. Round foci of 
T1 dark signal within the L5 and S1 vertebra from prior Intracept therapy. 
CORD SIGNAL: Normal distal spinal cord and cauda equina.  Conus terminates at 
L1-L2. 
ADDITIONAL FINDINGS: None. 
Modic I-II: L3-L4, L4-L5, L5-S1 
Ligamentum Flavum > 2.5 mm: All levels. 
-------------------------------------------------------------------------------- 
------ 
SEGMENTAL: 
T11-T12: Left greater than right facet hypertrophy, trace disc bulge; mild 
central canal narrowing. No significant right neural foraminal narrowing, mild 
left neural foraminal narrowing. 
T12-L1: No significant central canal narrowing.  No significant right neural 
foraminal narrowing. No significant left neural foraminal narrowing.  
L1-L2: No significant central canal narrowing.  No significant right neural 
foraminal narrowing. No significant left neural foraminal narrowing.  
L2-L3: Trace disc bulge, bilateral facet/ligamentum flavum hypertrophy; no 
significant central canal narrowing.  No significant right neural foraminal 
narrowing. No significant left neural foraminal narrowing.  
L3-L4: Loss of disc height with uncovering of the disc space, disc bulge 
eccentric to the right, bilateral facet/ligamentum flavum hypertrophy; moderate 
central canal narrowing with severe right subarticular recess narrowing 
(extensive posterior epidural fat at this level).  Severe right neural foraminal 
narrowing. No significant left neural foraminal narrowing. Progression of right 
neural foraminal narrowing. 
L4-L5: Disc bulge eccentric to the left, bilateral facet/ligamentum flavum 
hypertrophy; moderate central canal narrowing with severe left greater than 
right subarticular recess narrowing (prominent posterior epidural fat at this 
level).  Severe right neural foraminal narrowing. Moderate left neural foraminal 
narrowing. Progression in central canal and bilateral subarticular recess 
narrowing. Progression of right neural foraminal narrowing. 
L5-S1: Disc bulge with left subarticular to left foraminal herniation, left far 
lateral disc osteophyte complex, left facet hypertrophy; no significant central 
canal narrowing, severe left subarticular recess narrowing (suspected mass 
effect upon traversing left S1 nerve root by disc herniation).  No significant 
right neural foraminal narrowing. Moderate left neural foraminal narrowing.  
-------------------------------------------------------------------------------- 
------
IMPRESSION: 1.  Discogenic/degenerative changes as above. 
2.  Progression of stenosis at L3-L4 and L4-L5. 
3.  Stable disc herniation at L5-S1 with suspected mass effect upon traversing 
left S1 nerve root.

## 2023-12-13 IMAGING — MR MRI PELVIS WITHOUT CONTRAST
4 of 6 series · 19 of 48 positions shown · IV contrast (gadolinium)
Comparison: None prior of the pelvis.

________________________________________________________________________________________________ 
MRI PELVIS WITHOUT CONTRAST, 12/13/2023 [DATE]: 
CLINICAL INDICATION: Low back pain with radiation into lateral portion of right 
hip. Right hip bursitis.
TECHNIQUE: Multiplanar, multiecho position MR images of the pelvis were 
performed without intravenous gadolinium enhancement.

[Series 101: survey · axial · 10.0mm · 1.25mm/px · z∈[-33,+199]mm · 3 of 11 slices shown]
[im 1/11]
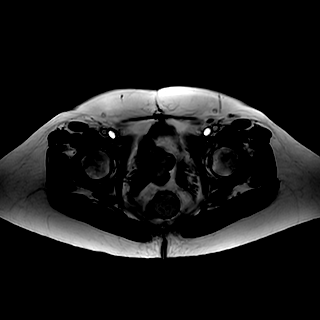
[im 6/11]
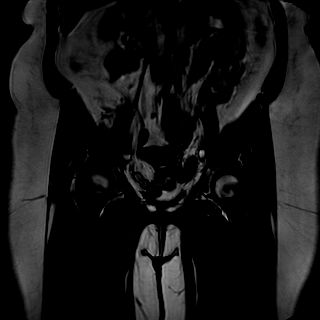
[im 11/11]
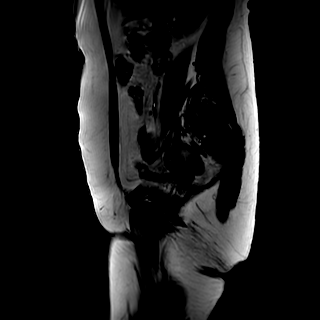

[Series 201: t1_(person_name) · axial · 6.0mm · 0.72mm/px · z∈[-134,+114]mm · 8 of 32 slices shown]
[im 1/32]
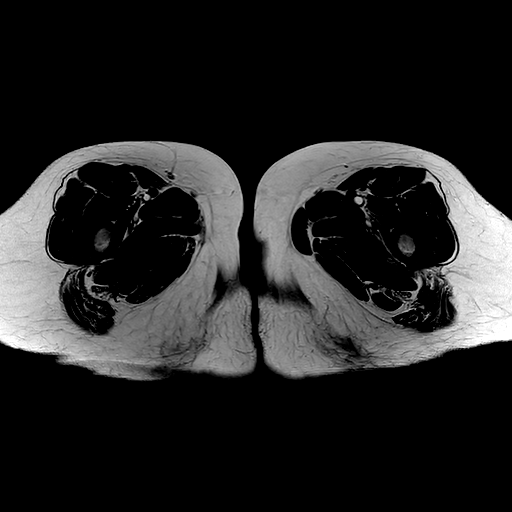
[im 5/32]
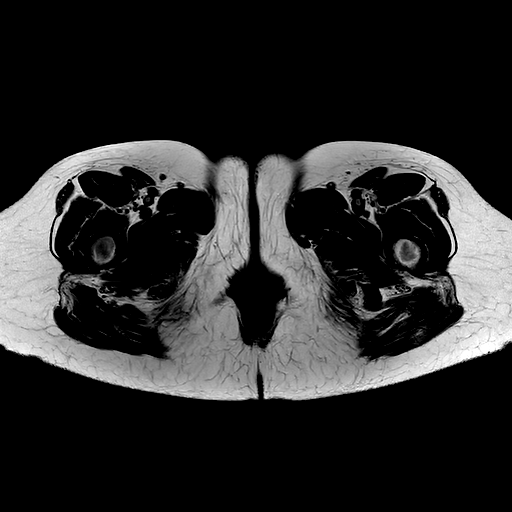
[im 9/32]
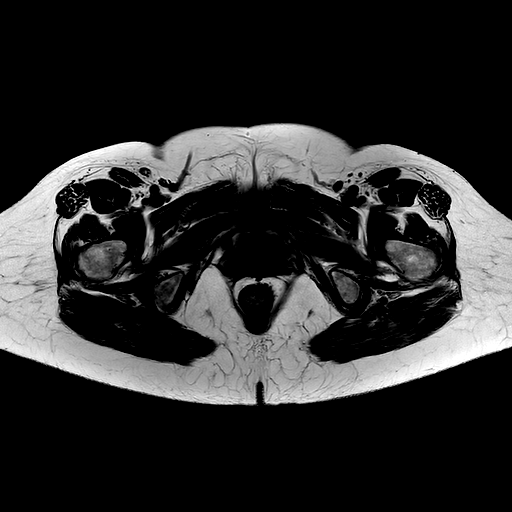
[im 14/32]
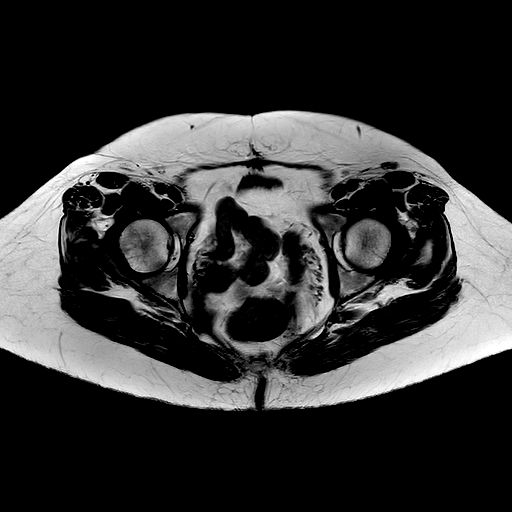
[im 18/32]
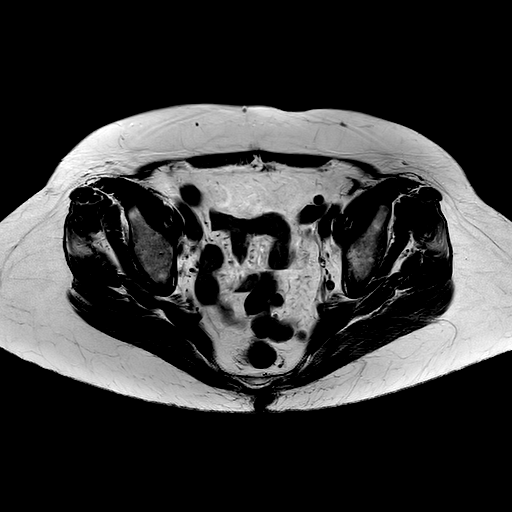
[im 23/32]
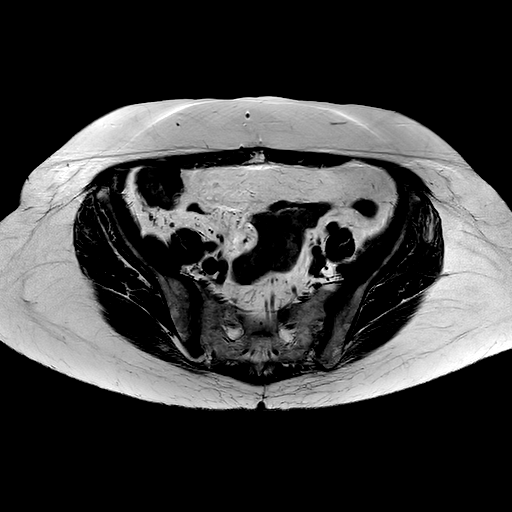
[im 27/32]
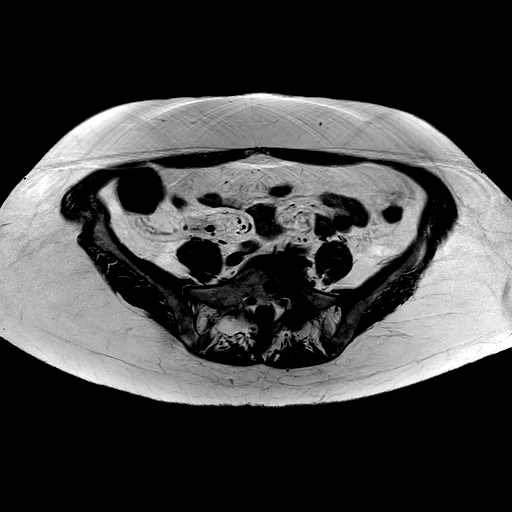
[im 32/32]
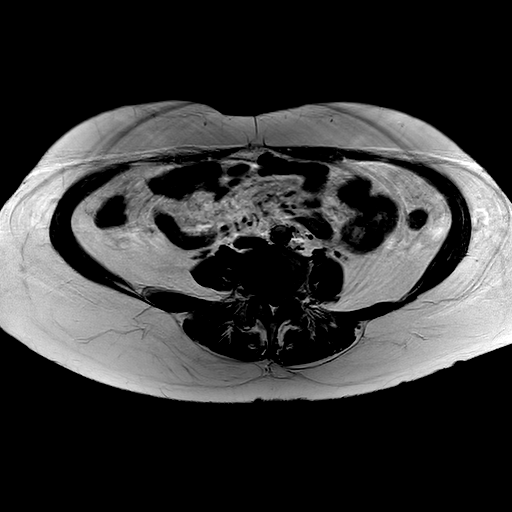

[Series 301: (person_name)_(person_name)_(person_name) · axial · 6.0mm · 0.64mm/px · z∈[-134,+74]mm · 5 of 32 slices shown]
[im 1/32]
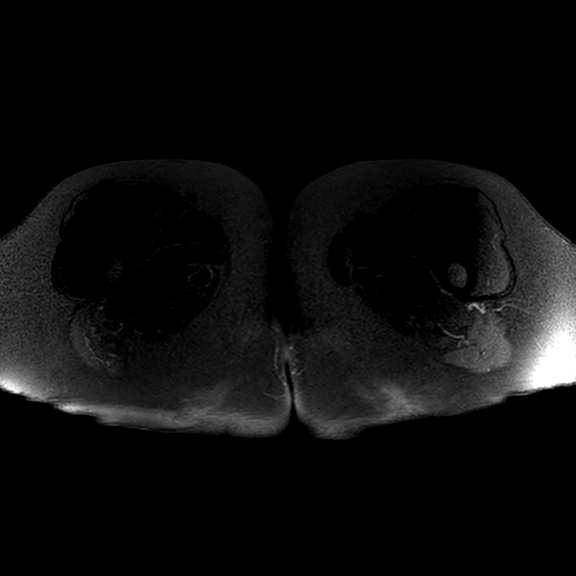
[im 5/32]
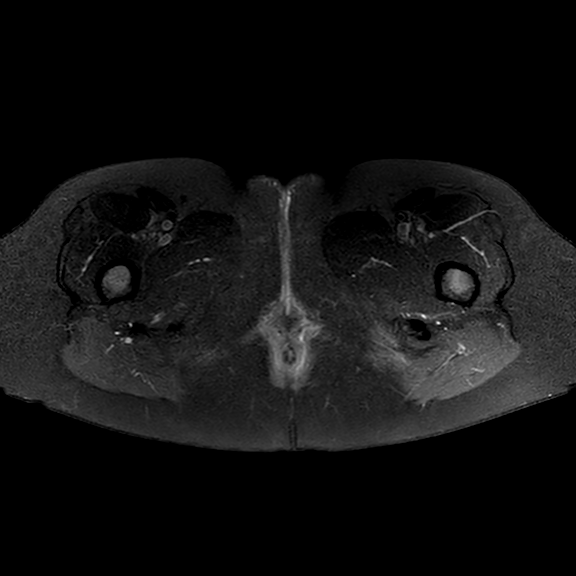
[im 9/32]
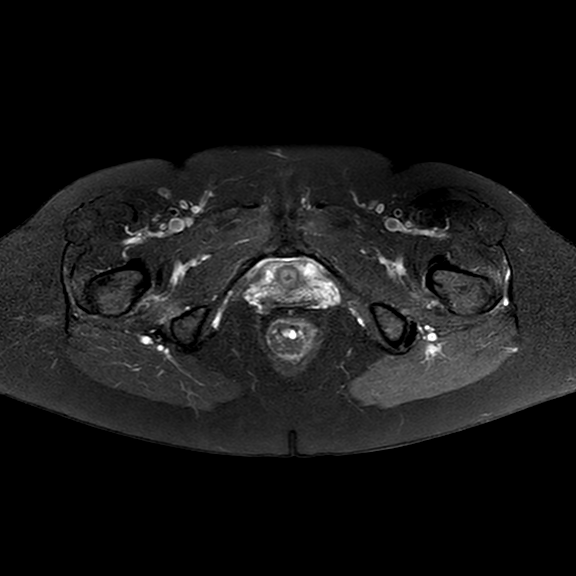
[im 18/32]
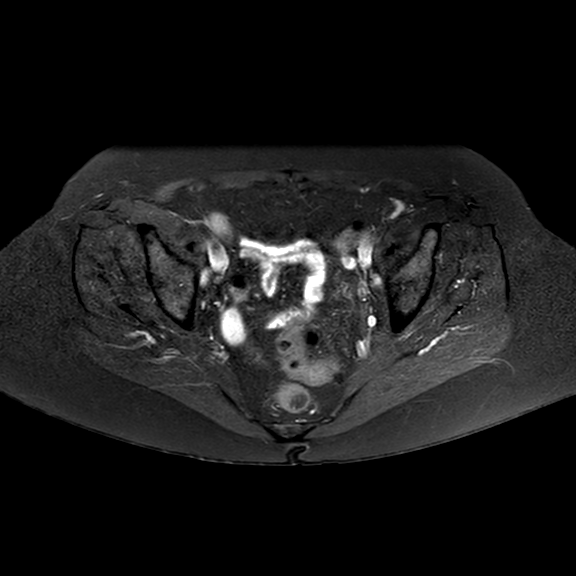
[im 27/32]
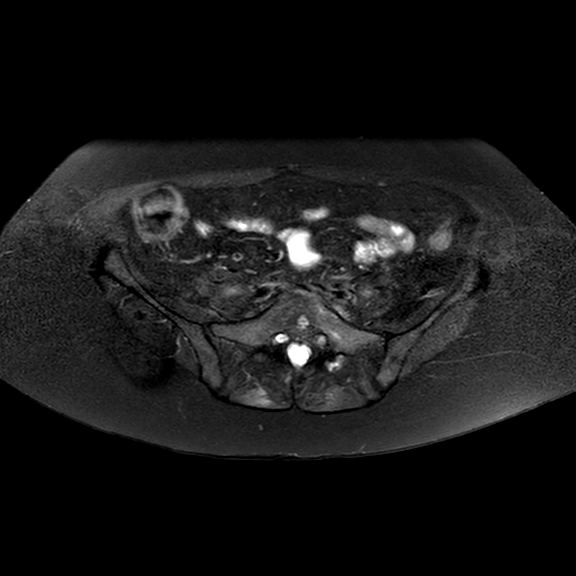

[Series 401: t1_cor · coronal · 5.0mm · 0.62mm/px · 3 of 33 slices shown]
[im 5/33]
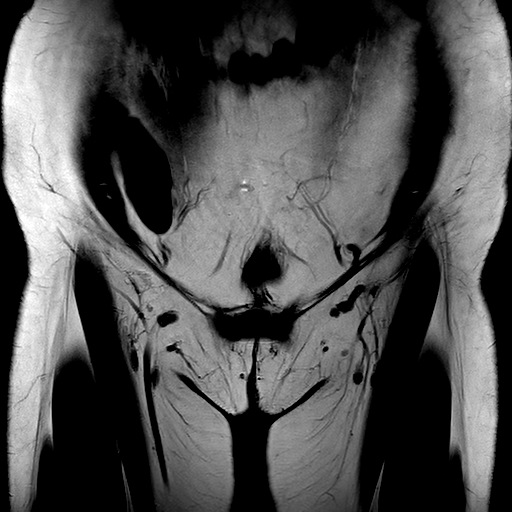
[im 19/33]
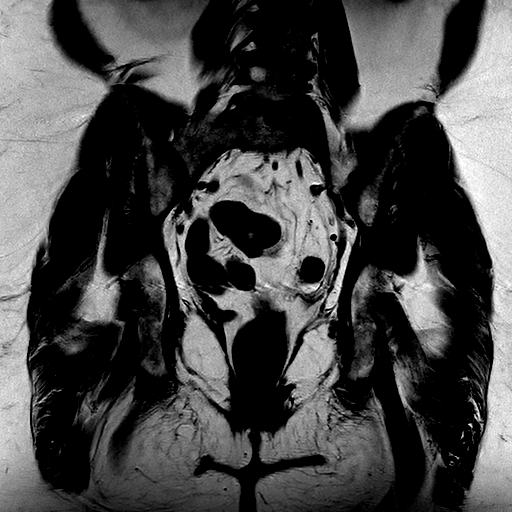
[im 28/33]
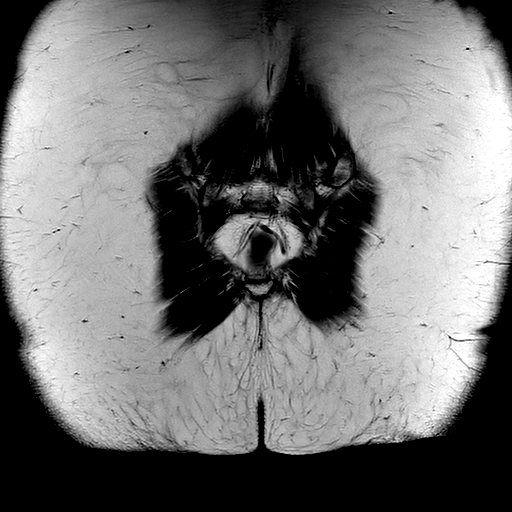

[19 of 48 positions shown; findings below may reference images not displayed]

FINDINGS: HIPS:  Moderately cartilaginous loss both hips. No labral tear. No paralabral 
cyst. No hip joint effusion. Both femoral heads maintain a spherical 
configuration without evidence of avascular necrosis or subarticular collapse. 
No abnormal morphology of the proximal femurs or acetabulum to predispose to 
impingement  
BONES: There is mild subcortical marrow edema involving the lateral periphery of 
the greater trochanteric region subjacent to the anterior facet insertion of the 
right gluteus medius. Edema is felt to be reactive. This is seen for example on 
series 301, image 22. There are marrow signal changes within L5 and S1 
compatible with Intracept procedure. Degenerative changes included lower lumbar 
spine with disc height loss, osteophytic spurring and facet arthropathy. There 
are degenerative changes of the symphysis pubis with mild marrow edema about the 
symphysis pubis. No fracture. SI joints show mild degenerative change. 
SOFT TISSUES: There is low-grade partial tearing of the bilateral abductor cuffs 
with mild peritendinous/peritrochanteric edema. The insertions of the iliopsoas 
tendons are intact. The origins of the hamstrings are preserved. Rectus 
abdominis-adductor complex is preserved. The musculature is symmetric without 
strain, atrophy or mass. No focal fluid collection or distended bursa. 
Specifically, no iliopsoas or trochanteric bursitis. Included neurovascular 
bundles are negative. Intrapelvic contents include sigmoid diverticula
IMPRESSION: 1.  Degenerative changes including moderate involvement of the hips. 
2.  Low-grade partial tearing of the bilateral abductor cuffs with mild 
peritrochanteric/peritendinous fluid and mild reactive marrow edema within the 
right greater trochanter. 
3.  Postsurgical changes lumbosacral spine of prior Intracept procedure.
